# Patient Record
Sex: Female | Born: 1970 | Race: White | Hispanic: No | Smoking: Never smoker
Health system: Southern US, Community
[De-identification: ages and names within clinical notes are randomized; demographics above are authoritative.]

## PROBLEM LIST (undated history)

## (undated) DIAGNOSIS — F419 Anxiety disorder, unspecified: Secondary | ICD-10-CM

---

## 2016-11-15 ENCOUNTER — Emergency Department (HOSPITAL_COMMUNITY): Payer: 59

## 2016-11-15 ENCOUNTER — Encounter (HOSPITAL_COMMUNITY): Payer: Self-pay

## 2016-11-15 ENCOUNTER — Emergency Department (HOSPITAL_COMMUNITY)
Admission: EM | Admit: 2016-11-15 | Discharge: 2016-11-15 | Disposition: A | Discharge status: Home / Self Care | Payer: 59 | Attending: Emergency Medicine | Admitting: Emergency Medicine

## 2016-11-15 DIAGNOSIS — R160 Hepatomegaly, not elsewhere classified: Secondary | ICD-10-CM | POA: Insufficient documentation

## 2016-11-15 DIAGNOSIS — Z79899 Other long term (current) drug therapy: Secondary | ICD-10-CM | POA: Diagnosis not present

## 2016-11-15 DIAGNOSIS — R079 Chest pain, unspecified: Secondary | ICD-10-CM

## 2016-11-15 DIAGNOSIS — K766 Portal hypertension: Secondary | ICD-10-CM | POA: Diagnosis not present

## 2016-11-15 DIAGNOSIS — F1010 Alcohol abuse, uncomplicated: Secondary | ICD-10-CM | POA: Diagnosis not present

## 2016-11-15 DIAGNOSIS — R1031 Right lower quadrant pain: Secondary | ICD-10-CM | POA: Insufficient documentation

## 2016-11-15 DIAGNOSIS — R0789 Other chest pain: Secondary | ICD-10-CM | POA: Insufficient documentation

## 2016-11-15 DIAGNOSIS — F1020 Alcohol dependence, uncomplicated: Secondary | ICD-10-CM

## 2016-11-15 HISTORY — DX: Anxiety disorder, unspecified: F41.9

## 2016-11-15 LAB — I-STAT BETA HCG BLOOD, ED (MC, WL, AP ONLY): I-stat hCG, quantitative: <5 m[IU]/mL (ref <5)

## 2016-11-15 LAB — COMPREHENSIVE METABOLIC PANEL
ALBUMIN: 5 g/dL (ref 3.5–5.0)
ALK PHOS: 118 U/L (ref 38–126)
ALT: 26 U/L (ref 14–54)
AST: 37 U/L (ref 15–41)
Anion gap: 15 (ref 5–15)
BILIRUBIN TOTAL: 0.4 mg/dL (ref 0.3–1.2)
BUN: 11 mg/dL (ref 6–20)
CALCIUM: 10.4 mg/dL — AB (ref 8.9–10.3)
CO2: 25 mmol/L (ref 22–32)
Chloride: 105 mmol/L (ref 101–111)
Creatinine, Ser: 0.69 mg/dL (ref 0.44–1.00)
GFR calc Af Amer: >60 mL/min (ref >60)
GFR calc non Af Amer: >60 mL/min (ref >60)
Glucose, Bld: 107 mg/dL — ABNORMAL HIGH (ref 65–99)
POTASSIUM: 3.5 mmol/L (ref 3.5–5.1)
Sodium: 145 mmol/L (ref 135–145)
Total Protein: 8.7 g/dL — ABNORMAL HIGH (ref 6.5–8.1)

## 2016-11-15 LAB — CBC
HEMATOCRIT: 37.5 % (ref 36.0–46.0)
Hemoglobin: 12 g/dL (ref 12.0–15.0)
MCH: 29.3 pg (ref 26.0–34.0)
MCHC: 32 g/dL (ref 30.0–36.0)
MCV: 91.5 fL (ref 78.0–100.0)
Platelets: 246 10*3/uL (ref 150–400)
RBC: 4.1 MIL/uL (ref 3.87–5.11)
RDW: 17.8 % — ABNORMAL HIGH (ref 11.5–15.5)
WBC: 3.4 10*3/uL — ABNORMAL LOW (ref 4.0–10.5)

## 2016-11-15 LAB — URINALYSIS, ROUTINE W REFLEX MICROSCOPIC
BACTERIA UA: NONE SEEN
Bilirubin Urine: NEGATIVE
GLUCOSE, UA: NEGATIVE mg/dL
Ketones, ur: NEGATIVE mg/dL
Leukocytes, UA: NEGATIVE
Nitrite: NEGATIVE
PROTEIN: NEGATIVE mg/dL
Specific Gravity, Urine: 1.005 (ref 1.005–1.030)
pH: 7 (ref 5.0–8.0)

## 2016-11-15 LAB — I-STAT TROPONIN, ED
TROPONIN I, POC: 0 ng/mL (ref 0.00–0.08)
Troponin i, poc: 0 ng/mL (ref 0.00–0.08)

## 2016-11-15 LAB — LIPASE, BLOOD: Lipase: 21 U/L (ref 11–51)

## 2016-11-15 LAB — D-DIMER, QUANTITATIVE (NOT AT ARMC): D DIMER QUANT: 0.82 ug{FEU}/mL — AB (ref 0.00–0.50)

## 2016-11-15 MED ORDER — MORPHINE SULFATE (PF) 4 MG/ML IV SOLN
4.0000 mg | Freq: Once | INTRAVENOUS | Status: AC
  Administered 2016-11-15: 4 mg via INTRAVENOUS
  Filled 2016-11-15: qty 1

## 2016-11-15 MED ORDER — GI COCKTAIL ~~LOC~~
30.0000 mL | Freq: Once | ORAL | Status: AC
  Administered 2016-11-15: 30 mL via ORAL
  Filled 2016-11-15: qty 30

## 2016-11-15 MED ORDER — DICYCLOMINE HCL 10 MG PO CAPS: 10.0000 mg | ORAL_CAPSULE | Freq: Three times a day (TID) | ORAL | 0 refills | Status: AC | PRN

## 2016-11-15 MED ORDER — SODIUM CHLORIDE 0.9 % IV BOLUS (SEPSIS)
1000.0000 mL | Freq: Once | INTRAVENOUS | Status: AC
  Administered 2016-11-15: 1000 mL via INTRAVENOUS

## 2016-11-15 MED ORDER — IOPAMIDOL (ISOVUE-300) INJECTION 61%
INTRAVENOUS | Status: AC
  Administered 2016-11-15: 100 mL
  Filled 2016-11-15: qty 100

## 2016-11-15 MED ORDER — IOPAMIDOL (ISOVUE-370) INJECTION 76%
INTRAVENOUS | Status: AC
  Administered 2016-11-15: 80 mL
  Filled 2016-11-15: qty 100

## 2016-11-15 MED ORDER — ONDANSETRON 4 MG PO TBDP: 4.0000 mg | ORAL_TABLET | Freq: Three times a day (TID) | ORAL | 0 refills | Status: AC | PRN

## 2016-11-15 MED ORDER — PANTOPRAZOLE SODIUM 20 MG PO TBEC: 20.0000 mg | DELAYED_RELEASE_TABLET | Freq: Two times a day (BID) | ORAL | 0 refills | Status: AC

## 2016-11-15 MED ORDER — DICYCLOMINE HCL 10 MG/ML IM SOLN
20.0000 mg | Freq: Once | INTRAMUSCULAR | Status: AC
  Administered 2016-11-15: 20 mg via INTRAMUSCULAR
  Filled 2016-11-15: qty 2

## 2016-11-15 MED ORDER — CHLORDIAZEPOXIDE HCL 25 MG PO CAPS: ORAL_CAPSULE | ORAL | 0 refills | Status: AC

## 2016-11-15 MED ORDER — SODIUM CHLORIDE 0.9 % IJ SOLN
INTRAMUSCULAR | Status: AC
  Filled 2016-11-15: qty 50

## 2016-11-15 MED ORDER — LORAZEPAM 1 MG PO TABS
1.0000 mg | ORAL_TABLET | Freq: Once | ORAL | Status: AC
  Administered 2016-11-15: 1 mg via ORAL
  Filled 2016-11-15: qty 1

## 2016-11-15 NOTE — ED Notes (Signed)
Patient transported to CT 

## 2016-11-15 NOTE — ED Triage Notes (Signed)
PT RECEIVED FROM THE [facility]PORT VIA EMS C/O ANXIETY AND ABDOMINAL PAIN. PER EMS, THE PT IS 3 DAYS INTO HER MENSTRUAL CYCLE AND HAS BEEN PASSING CLOTS. EMS ALSO STS A STRONG SMELL OF ALCOHOL. BP 140/80 HR 110 EKG- ST

## 2016-11-15 NOTE — ED Provider Notes (Signed)
[facility]-EMERGENCY DEPT Provider Note   CSN: [id number] Arrival date & time: 11/15/16  1113     History   Chief Complaint Chief Complaint  Patient presents with  . Chest Pain  . Abdominal Pain  . Anxiety    HPI Cassandra Casey is a 46 y.o. female.  HPI   Had tight feeling in the chest, started around 9AM, lasted a few hours Middle of chest to left arm, had nausea, shortness of breath. No diaphoresis. Felt lightheadedness.  Also had right sided abdominal pain but it has resolved. Only had water, OJ, coffee this AM, did not make it worse.  No hx of other medical problems. No hx of surgery. No smoking of cigarettes. No other drugs.  Drinks etoh, but not today per pt.  No leg pain or swelling, hx of blood clots, no fam hx of heart disease Longest flight 1-2 hr  No hx of similar symptoms    Denies anxiety   Past Medical History:  Diagnosis Date  . Anxiety     There are no active problems to display for this patient.   History reviewed. No pertinent surgical history.  OB History    No data available       Home Medications    Prior to Admission medications   Medication Sig Start Date End Date Taking? Authorizing Provider  chlordiazePOXIDE (LIBRIUM) 25 MG capsule 50mg  PO TID x 1D, then 25-50mg  PO BID X 1D, then 25-50mg  PO QD X 1D 11/15/16   Alvira Monday, MD  dicyclomine (BENTYL) 10 MG capsule Take 1 capsule (10 mg total) by mouth 3 (three) times daily as needed for spasms. 11/15/16   Alvira Monday, MD  ondansetron (ZOFRAN ODT) 4 MG disintegrating tablet Take 1 tablet (4 mg total) by mouth every 8 (eight) hours as needed for nausea or vomiting. 11/15/16   Alvira Monday, MD  pantoprazole (PROTONIX) 20 MG tablet Take 1 tablet (20 mg total) by mouth 2 (two) times daily. 11/15/16   Alvira Monday, MD    Family History History reviewed. No pertinent family history.  Social History Social History  Substance Use Topics  . Smoking status: Never Smoker  . Smokeless  tobacco: Never Used  . Alcohol use No     Allergies   Nsaids   Review of Systems Review of Systems  Constitutional: Negative for fever.  HENT: Negative for congestion and sore throat.   Eyes: Negative for visual disturbance.  Respiratory: Positive for shortness of breath. Negative for cough.   Cardiovascular: Positive for chest pain and palpitations. Negative for leg swelling.  Gastrointestinal: Positive for abdominal pain and nausea. Negative for constipation, diarrhea and vomiting.  Genitourinary: Positive for vaginal bleeding (heavier menses than normal). Negative for difficulty urinating.  Musculoskeletal: Negative for back pain and neck pain.  Skin: Negative for rash.  Neurological: Positive for light-headedness. Negative for syncope, weakness, numbness and headaches.     Physical Exam Updated Vital Signs BP (!) 144/105   Pulse 103   Temp 97.7 F (36.5 C) (Oral)   Resp 15   Ht 5\' 3"  (1.6 m)   Wt 125 lb (56.7 kg)   LMP 11/13/2016   SpO2 98%   BMI 22.14 kg/m   Physical Exam  Constitutional: She is oriented to person, place, and time. She appears well-developed and well-nourished. No distress.  HENT:  Head: Normocephalic and atraumatic.  Eyes: Conjunctivae and EOM are normal.  Neck: Normal range of motion.  Cardiovascular: Regular rhythm, normal heart sounds and  intact distal pulses.  Tachycardia present.  Exam reveals no gallop and no friction rub.   No murmur heard. Pulmonary/Chest: Effort normal and breath sounds normal. No respiratory distress. She has no wheezes. She has no rales.  Abdominal: Soft. She exhibits no distension. There is tenderness (RLQ). There is no guarding.  Musculoskeletal: She exhibits no edema or tenderness.  Neurological: She is alert and oriented to person, place, and time.  Skin: Skin is warm and dry. No rash noted. She is not diaphoretic. No erythema.  Nursing note and vitals reviewed.    ED Treatments / Results  Labs (all labs  ordered are listed, but only abnormal results are displayed) Labs Reviewed  CBC - Abnormal; Notable for the following:       Result Value   WBC 3.4 (*)    RDW 17.8 (*)    All other components within normal limits  COMPREHENSIVE METABOLIC PANEL - Abnormal; Notable for the following:    Glucose, Bld 107 (*)    Calcium 10.4 (*)    Total Protein 8.7 (*)    All other components within normal limits  URINALYSIS, ROUTINE W REFLEX MICROSCOPIC - Abnormal; Notable for the following:    Color, Urine STRAW (*)    Hgb urine dipstick LARGE (*)    Squamous Epithelial / LPF 0-5 (*)    All other components within normal limits  D-DIMER, QUANTITATIVE (NOT AT [facility]) - Abnormal; Notable for the following:    D-Dimer, Quant 0.82 (*)    All other components within normal limits  LIPASE, BLOOD  I-STAT TROPOININ, ED  I-STAT BETA HCG BLOOD, ED ([facility], [facility], [facility] ONLY)  I-STAT TROPOININ, ED  I-STAT TROPOININ, ED    EKG  EKG Interpretation None       Radiology Dg Chest 2 View  Result Date: 11/15/2016 CLINICAL DATA:  Chest pain EXAM: CHEST  2 VIEW COMPARISON:  None. FINDINGS: Lungs are clear. Heart size and pulmonary vascularity are normal. No adenopathy. No pneumothorax. No bone lesions. IMPRESSION: No edema or consolidation. Electronically Signed   By: Bretta Bang III M.D.   On: 11/15/2016 12:18   Ct Angio Chest Pe W And/or Wo Contrast  Result Date: 11/15/2016 CLINICAL DATA:  Chest pain EXAM: CT ANGIOGRAPHY CHEST WITH CONTRAST TECHNIQUE: Multidetector CT imaging of the chest was performed using the standard protocol during bolus administration of intravenous contrast. Multiplanar CT image reconstructions and MIPs were obtained to evaluate the vascular anatomy. CONTRAST:  80 mL Isovue 370 IV COMPARISON:  Chest x-ray 11/15/2016 FINDINGS: Cardiovascular: Negative for pulmonary embolism. Basilar artery is difficult to evaluate due to breathing motion during the injection. Pulmonary arteries normal in  caliber. Negative for aortic aneurysm or dissection. Heart size upper normal. Mediastinum/Nodes: Negative Lungs/Pleura: Lungs are well aerated and clear. No infiltrate or effusion or mass lesion the lungs. Upper Abdomen: Fatty infiltration of the liver. No mass lesion. Small hiatal hernia. Musculoskeletal: Negative Review of the MIP images confirms the above findings. IMPRESSION: Negative for pulmonary embolism.  No acute abnormality in the chest. Fatty infiltration of liver Small hiatal hernia Electronically Signed   By: Marlan Palau M.D.   On: 11/15/2016 15:52   Ct Abdomen Pelvis W Contrast  Result Date: 11/15/2016 CLINICAL DATA:  Acute right lower quadrant abdominal pain with anxiety, abnormal menstrual bleeding with passage of blood clots. EXAM: CT ABDOMEN AND PELVIS WITH CONTRAST TECHNIQUE: Multidetector CT imaging of the abdomen and pelvis was performed using the standard protocol following bolus administration of  intravenous contrast. CONTRAST:  100 ISOVUE-300 IOPAMIDOL (ISOVUE-300) INJECTION 61% COMPARISON:  None available FINDINGS: Lower chest: No acute abnormality. Hepatobiliary: Diffuse hypoattenuation of the liver parenchyma compatible with hepatic steatosis. Liver is enlarged measuring 19.9 cm in craniocaudal length. Hepatic and portal veins are patent. No focal hepatic abnormality or intrahepatic biliary dilatation. Main portal vein, mesenteric vein and portal splenic confluence are mildly dilated. Enlarged coronary vein off of the main portal vein supplying tortuous vessels leading to the stomach fundus compatible with small gastric varices. CT findings compatible with an degree of portal hypertension. Gallbladder biliary system remain unremarkable. Pancreas: Unremarkable. No pancreatic ductal dilatation or surrounding inflammatory changes. Spleen: Normal in size. No focal abnormality. Splenic capsular calcifications noted superiorly, nonspecific. No focal abnormality. Adrenals/Urinary Tract:  Adrenal glands are unremarkable. Kidneys are normal, without renal calculi, focal lesion, or hydronephrosis. Bladder is unremarkable. Stomach/Bowel: Negative for bowel obstruction, significant dilatation, ileus, or free air. Normal appendix demonstrated. No fluid collection or abscess. Vascular/Lymphatic: No significant vascular findings are present. No enlarged abdominal or pelvic lymph nodes. Reproductive: Uterus and bilateral adnexa are unremarkable. Tampon noted in the vaginal canal. No fluid collection or abscess within the pelvis. Other: Small fat containing anterior abdominal wall ventral hernia just above the umbilicus. No herniation of bowel or incarceration of bowel. Musculoskeletal: No acute or significant osseous findings. IMPRESSION: Hepatic steatosis and associated hepatomegaly. Dilated portal vein, portal splenic confluence and superior mesenteric vein. 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 Findings compatible with a component of portal hypertension. Small midline fat containing ventral hernia superior to the umbilicus. No other acute intra-abdominal or pelvic finding. Normal appendix. Electronically Signed   By: Judie PetitM.  Shick M.D.   On: 11/15/2016 15:15    Procedures Procedures (including critical care time)  Medications Ordered in ED Medications  iopamidol (ISOVUE-300) 61 % injection (100 mLs  Contrast Given 11/15/16 1448)  morphine 4 MG/ML injection 4 mg (4 mg Intravenous Given 11/15/16 1528)  iopamidol (ISOVUE-370) 76 % injection (80 mLs  Contrast Given 11/15/16 1534)  sodium chloride 0.9 % bolus 1,000 mL (0 mLs Intravenous Stopped 11/15/16 1746)  LORazepam (ATIVAN) tablet 1 mg (1 mg Oral Given 11/15/16 1622)  dicyclomine (BENTYL) injection 20 mg (20 mg Intramuscular Given 11/15/16 1623)  gi cocktail (Maalox,Lidocaine,Donnatal) (30 mLs Oral Given 11/15/16 1625)     Initial Impression / Assessment and Plan / ED Course  I have reviewed the triage vital signs and the nursing  notes.  Pertinent labs & imaging results that were available during my care of the patient were reviewed by me and considered in my medical decision making (see chart for details).     46yo female presents with concern for chest pain, abdominal pain. Pregnancy test negative.  Denies vaginal discharge/STI concerns, doubt torsion/TOA/PID. Reports low appetite, CT abdomen done showing no appendicitis.  Regarding CP, delta ECG negative. Troponin negative x2. No sign of ACS. CT PE study negative for PE.   CT abdomen does show signs of portal hypertension, hepatomegaly.  Patient does admit to ETOH dependence, hx of withdrawal seizure.  Given IV ativan for concern for mild withdrawal/tachycardia.  Discussed concerns of hepatic findings with patient. Reports she would like to stop using ETOH, she is aware of programs in [place]. Gave rx for librium taper with strict instructions to avoid etoh. Reports she will follow up CLT with PCP, and recommend GI follow up.  Suspect CP, abd pain related to ETOH use. Patient discharged in stable condition with understanding of reasons  to return.   Final Clinical Impressions(s) / ED Diagnoses   Final diagnoses:  Alcoholism (HCC)  Chest pain, unspecified type  Portal hypertension (HCC)  Hepatomegaly  Right lower quadrant abdominal pain    New Prescriptions Discharge Medication List as of 11/15/2016  4:30 PM    START taking these medications   Details  chlordiazePOXIDE (LIBRIUM) 25 MG capsule 50mg  PO TID x 1D, then 25-50mg  PO BID X 1D, then 25-50mg  PO QD X 1D, Print    dicyclomine (BENTYL) 10 MG capsule Take 1 capsule (10 mg total) by mouth 3 (three) times daily as needed for spasms., Starting Mon 11/15/2016, Print    pantoprazole (PROTONIX) 20 MG tablet Take 1 tablet (20 mg total) by mouth 2 (two) times daily., Starting Mon 11/15/2016, Print         Alvira Monday, MD 11/15/16 [phone]

## 2016-11-15 NOTE — ED Notes (Signed)
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 Longest flight this week was 2 hours.

## 2018-01-13 IMAGING — CT CT ANGIO CHEST
2 of 6 series · 18 of 46 positions shown · IV contrast (isovue)
Comparison: Chest x-ray 11/15/2016

CLINICAL DATA: Chest pain

EXAM:
CT ANGIOGRAPHY CHEST WITH CONTRAST
TECHNIQUE: Multidetector CT imaging of the chest was performed using the
standard protocol during bolus administration of intravenous
contrast. Multiplanar CT image reconstructions and MIPs were
obtained to evaluate the vascular anatomy.
CONTRAST:  80 mL Isovue 370 IV

[Series 6: thins for pacs · axial · 0.72mm/px · z∈[+1246,+1452]mm · 15 of 226 slices shown]
[im 10/226  lung]
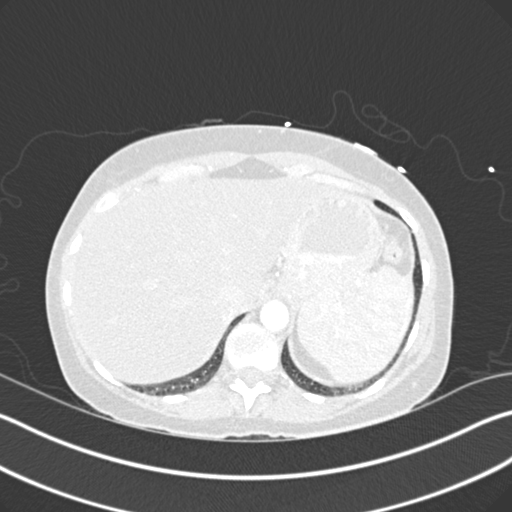
[im 30/226  soft-tissue]
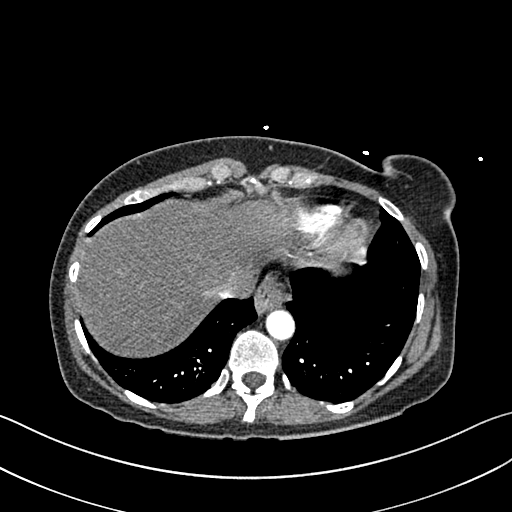
[im 40/226  lung]
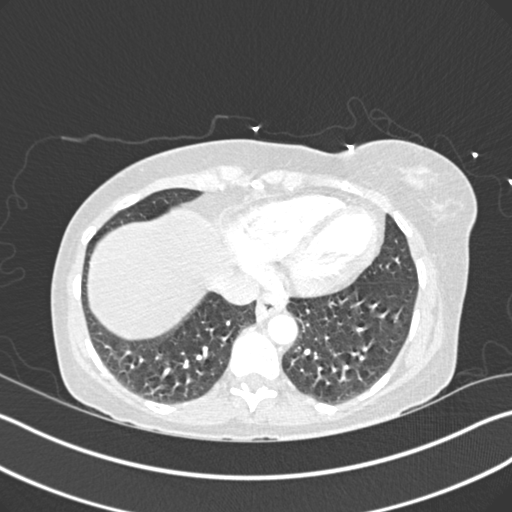
[im 59/226  soft-tissue]
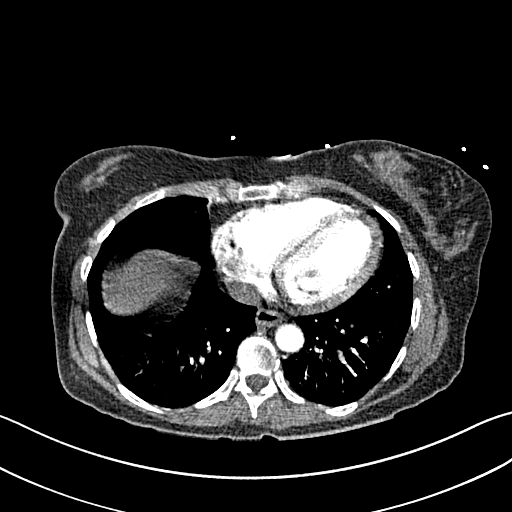
[im 69/226  lung]
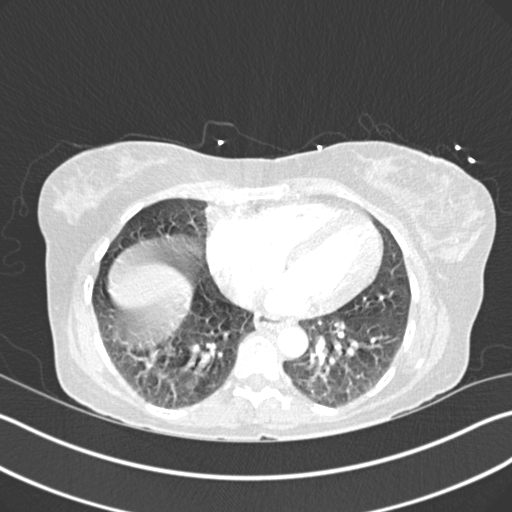
[im 89/226  soft-tissue]
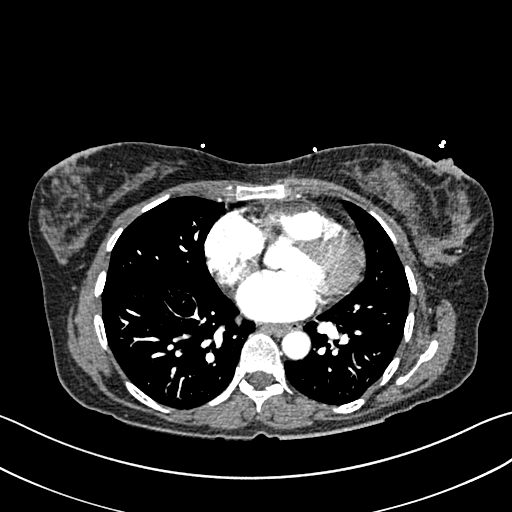
[im 98/226  lung]
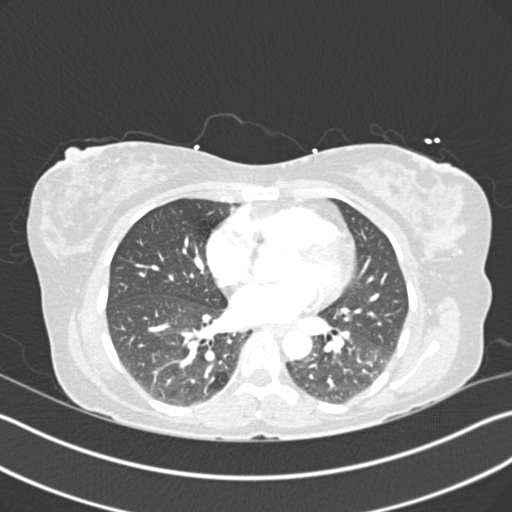
[im 118/226  soft-tissue]
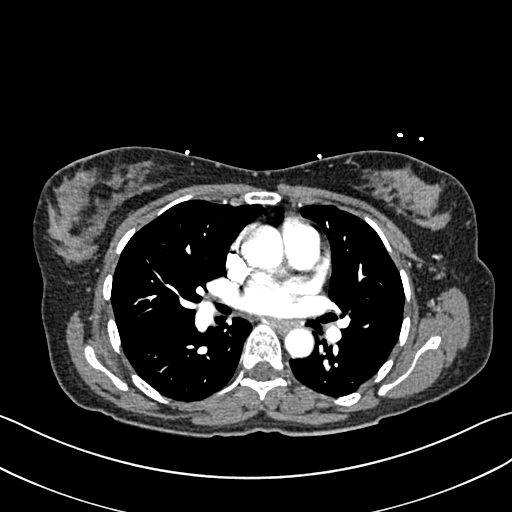
[im 128/226  lung]
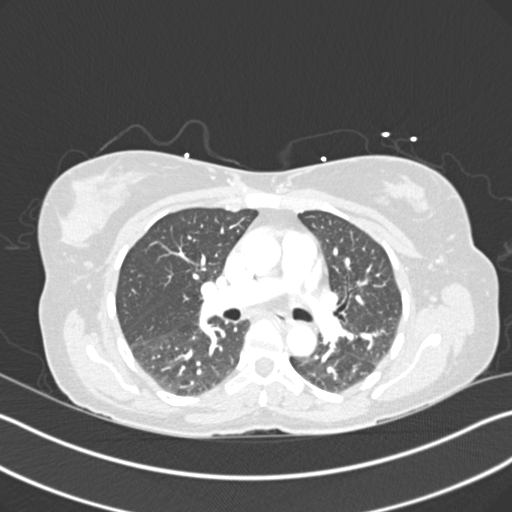
[im 137/226  soft-tissue]
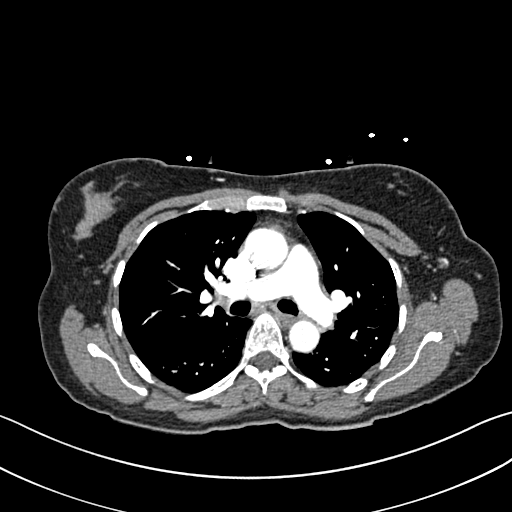
[im 157/226  lung]
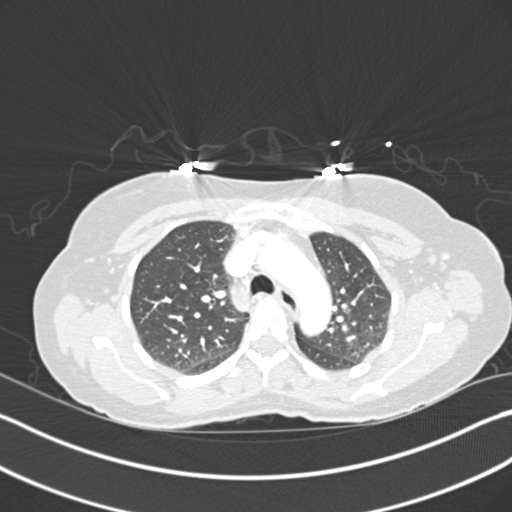
[im 167/226  soft-tissue]
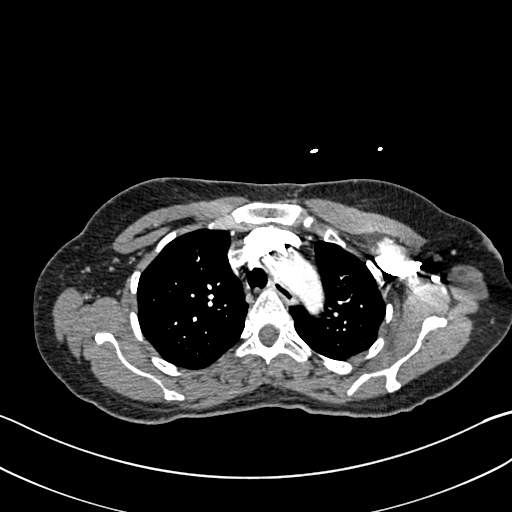
[im 186/226  lung]
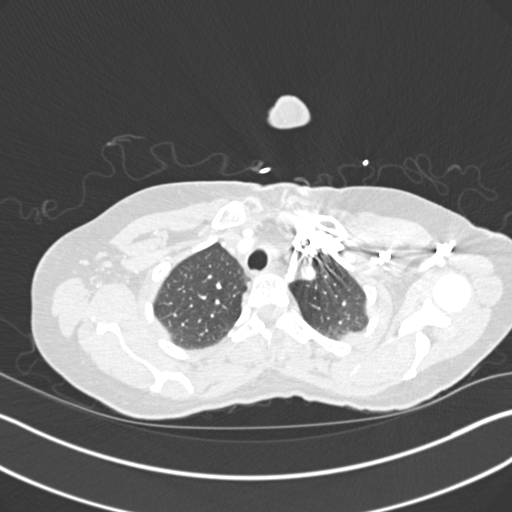
[im 196/226  soft-tissue]
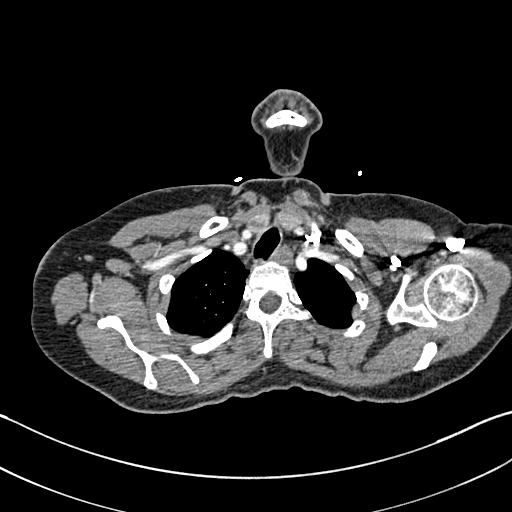
[im 216/226  lung]
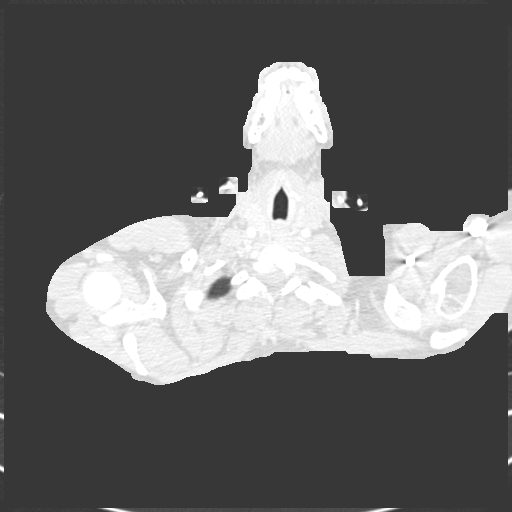

[Series 8: coronal mpr · coronal · 0.44mm/px · 3 of 106 slices shown]
[im 27/106  soft-tissue]
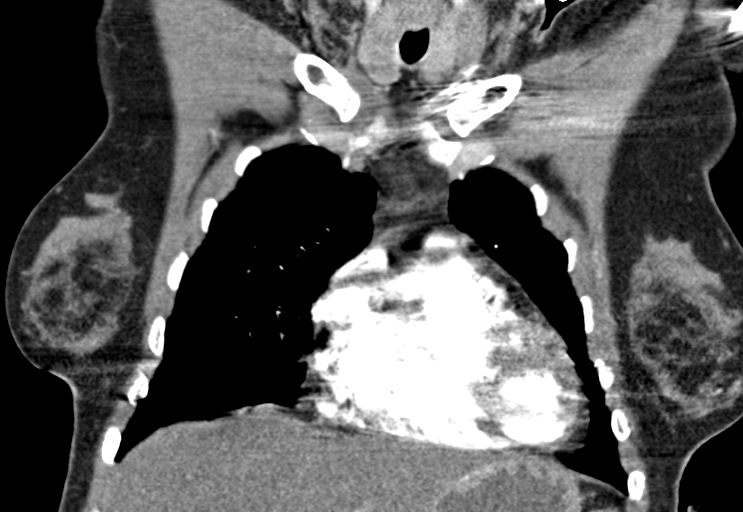
[im 53/106  soft-tissue]
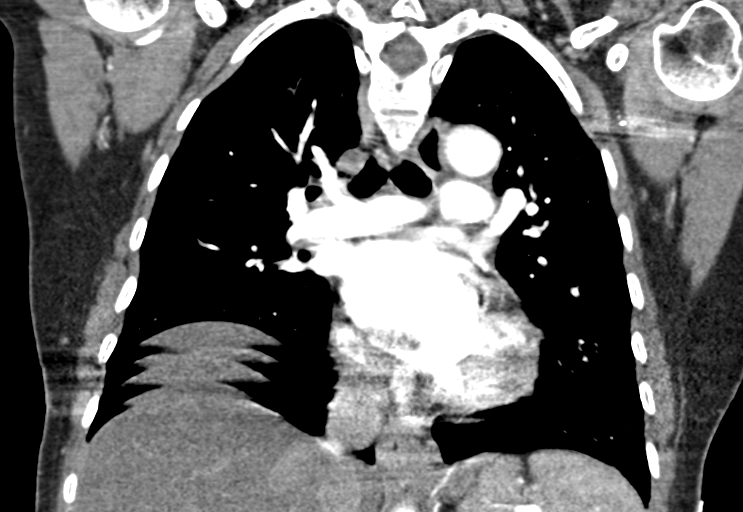
[im 79/106  soft-tissue]
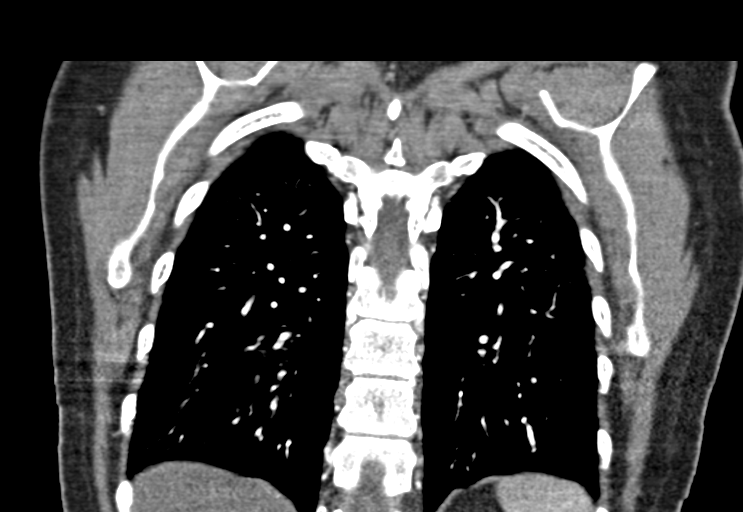

[18 of 46 positions shown; findings below may reference images not displayed]

FINDINGS: Cardiovascular: Negative for pulmonary embolism. Basilar artery is
difficult to evaluate due to breathing motion during the injection.
Pulmonary arteries normal in caliber. Negative for aortic aneurysm
or dissection. Heart size upper normal.

Mediastinum/Nodes: Negative

Lungs/Pleura: Lungs are well aerated and clear. No infiltrate or
effusion or mass lesion the lungs.

Upper Abdomen: Fatty infiltration of the liver. No mass lesion.
Small hiatal hernia.

Musculoskeletal: Negative

Review of the MIP images confirms the above findings.
IMPRESSION: Negative for pulmonary embolism.  No acute abnormality in the chest.

Fatty infiltration of liver

Small hiatal hernia
